# Patient Record
Sex: Male | Born: 1995 | Race: White | Hispanic: No | Marital: Single | Smoking: Never smoker
Health system: Southern US, Community
[De-identification: ages and names within clinical notes are randomized; demographics above are authoritative.]

## PROBLEM LIST (undated history)

## (undated) DIAGNOSIS — J45909 Unspecified asthma, uncomplicated: Secondary | ICD-10-CM

## (undated) DIAGNOSIS — R000 Tachycardia, unspecified: Secondary | ICD-10-CM

## (undated) HISTORY — PX: TONSILLECTOMY: SUR1361

---
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
## 2016-03-02 DIAGNOSIS — M67431 Ganglion, right wrist: Secondary | ICD-10-CM | POA: Diagnosis not present

## 2016-04-23 DIAGNOSIS — Z111 Encounter for screening for respiratory tuberculosis: Secondary | ICD-10-CM | POA: Diagnosis not present

## 2016-05-21 DIAGNOSIS — M25521 Pain in right elbow: Secondary | ICD-10-CM | POA: Diagnosis not present

## 2016-05-21 DIAGNOSIS — M67431 Ganglion, right wrist: Secondary | ICD-10-CM | POA: Diagnosis not present

## 2016-06-14 DIAGNOSIS — B079 Viral wart, unspecified: Secondary | ICD-10-CM | POA: Diagnosis not present

## 2016-08-08 DIAGNOSIS — J019 Acute sinusitis, unspecified: Secondary | ICD-10-CM | POA: Diagnosis not present

## 2016-08-26 DIAGNOSIS — B078 Other viral warts: Secondary | ICD-10-CM | POA: Diagnosis not present

## 2016-08-26 DIAGNOSIS — D1801 Hemangioma of skin and subcutaneous tissue: Secondary | ICD-10-CM | POA: Diagnosis not present

## 2016-08-26 DIAGNOSIS — D2261 Melanocytic nevi of right upper limb, including shoulder: Secondary | ICD-10-CM | POA: Diagnosis not present

## 2016-08-26 DIAGNOSIS — D2262 Melanocytic nevi of left upper limb, including shoulder: Secondary | ICD-10-CM | POA: Diagnosis not present

## 2016-08-26 DIAGNOSIS — D225 Melanocytic nevi of trunk: Secondary | ICD-10-CM | POA: Diagnosis not present

## 2016-09-27 DIAGNOSIS — J209 Acute bronchitis, unspecified: Secondary | ICD-10-CM | POA: Diagnosis not present

## 2016-09-27 DIAGNOSIS — R509 Fever, unspecified: Secondary | ICD-10-CM | POA: Diagnosis not present

## 2016-10-27 ENCOUNTER — Encounter: Payer: Self-pay | Admitting: Internal Medicine

## 2016-11-02 ENCOUNTER — Ambulatory Visit (INDEPENDENT_AMBULATORY_CARE_PROVIDER_SITE_OTHER): Payer: BLUE CROSS/BLUE SHIELD | Admitting: Internal Medicine

## 2016-11-02 VITALS — BP 110/90 | HR 94 | Ht 71.0 in | Wt 162.2 lb

## 2016-11-02 DIAGNOSIS — R000 Tachycardia, unspecified: Secondary | ICD-10-CM

## 2016-11-02 NOTE — Progress Notes (Signed)
      Patient Care Team: Farris HasAaron Morrow, MD as PCP - General (Family Medicine)   HPI  Joseph Hartman is a 20 y.o. male seeen in followup for sinus tach  He had a great semester at  [employer]  exercisng running with rapid rates but no LH    Records and Results Reviewed   Past Medical History:  Diagnosis Date  . Asthma   . Tachycardia    as a child he had episodes of tachycardia and was seen by pediatric cardiologist but no difinitive diagnosis could be made and it was self limiting    Past Surgical History:  Procedure Laterality Date  . TONSILLECTOMY      Current Outpatient Prescriptions  Medication Sig Dispense Refill  . albuterol (PROVENTIL HFA;VENTOLIN HFA) 108 (90 Base) MCG/ACT inhaler Inhale 1 puff into the lungs every 6 (six) hours as needed for wheezing or shortness of breath.    Marland Kitchen. omeprazole (PRILOSEC) 40 MG capsule Take 1 capsule (40 mg total) by mouth daily. 30 capsule 1   No current facility-administered medications for this visit.     Allergies  Allergen Reactions  . Cephalosporins Hives      Review of Systems negative except from HPI and PMH  Physical Exam BP 110/90   Pulse 94   Ht 5\' 11"  (1.803 m)   Wt 162 lb 3.2 oz (73.6 kg)   SpO2 98%   BMI 22.62 kg/m  Well developed and nourished in no acute distress HENT normal Neck supple with JVP-flat Clear Regular rate and rhythm, no murmurs or gallops Abd-soft with active BS No Clubbing cyanosis edema Skin-warm and dry A & Oriented  Grossly normal sensory and motor function  ECG personally reviewed  Sinus 89 15/09/33   Assessment and  Plan  Sinus tachy//dysautonomia   overall has done really well managing his symptoms  Continue current strategy including exercise        Current medicines are reviewed at length with the patient today .  The patient does not  have concerns regarding medicines.

## 2016-11-02 NOTE — Patient Instructions (Signed)
Medication Instructions: - Your physician recommends that you continue on your current medications as directed. Please refer to the Current Medication list given to you today.  Labwork: - none ordered  Procedures/Testing: - none ordered  Follow-Up: - Your physician wants you to follow-up in: May 2018 with Dr. Graciela HusbandsKlein. You will receive a reminder letter in the mail two months in advance. If you don't receive a letter, please call our office to schedule the follow-up appointment.  Any Additional Special Instructions Will Be Listed Below (If Applicable).     If you need a refill on your cardiac medications before your next appointment, please call your pharmacy.

## 2017-02-11 DIAGNOSIS — J309 Allergic rhinitis, unspecified: Secondary | ICD-10-CM | POA: Diagnosis not present

## 2017-02-11 DIAGNOSIS — Z68.41 Body mass index (BMI) pediatric, 5th percentile to less than 85th percentile for age: Secondary | ICD-10-CM | POA: Diagnosis not present

## 2017-03-29 DIAGNOSIS — D2271 Melanocytic nevi of right lower limb, including hip: Secondary | ICD-10-CM | POA: Diagnosis not present

## 2017-03-29 DIAGNOSIS — B070 Plantar wart: Secondary | ICD-10-CM | POA: Diagnosis not present

## 2017-05-01 DIAGNOSIS — J0100 Acute maxillary sinusitis, unspecified: Secondary | ICD-10-CM | POA: Diagnosis not present

## 2017-05-05 DIAGNOSIS — M67431 Ganglion, right wrist: Secondary | ICD-10-CM | POA: Diagnosis not present

## 2017-07-11 DIAGNOSIS — S99921A Unspecified injury of right foot, initial encounter: Secondary | ICD-10-CM | POA: Diagnosis not present

## 2017-07-11 DIAGNOSIS — Z68.41 Body mass index (BMI) pediatric, 5th percentile to less than 85th percentile for age: Secondary | ICD-10-CM | POA: Diagnosis not present

## 2017-08-23 DIAGNOSIS — J019 Acute sinusitis, unspecified: Secondary | ICD-10-CM | POA: Diagnosis not present

## 2017-08-23 DIAGNOSIS — Z68.41 Body mass index (BMI) pediatric, 5th percentile to less than 85th percentile for age: Secondary | ICD-10-CM | POA: Diagnosis not present

## 2017-10-31 DIAGNOSIS — Z23 Encounter for immunization: Secondary | ICD-10-CM | POA: Diagnosis not present

## 2017-10-31 DIAGNOSIS — F909 Attention-deficit hyperactivity disorder, unspecified type: Secondary | ICD-10-CM | POA: Diagnosis not present

## 2017-11-17 DIAGNOSIS — J329 Chronic sinusitis, unspecified: Secondary | ICD-10-CM | POA: Diagnosis not present

## 2017-12-16 DIAGNOSIS — J019 Acute sinusitis, unspecified: Secondary | ICD-10-CM | POA: Diagnosis not present

## 2017-12-16 DIAGNOSIS — Z68.41 Body mass index (BMI) pediatric, 5th percentile to less than 85th percentile for age: Secondary | ICD-10-CM | POA: Diagnosis not present

## 2017-12-16 DIAGNOSIS — J309 Allergic rhinitis, unspecified: Secondary | ICD-10-CM | POA: Diagnosis not present

## 2017-12-23 DIAGNOSIS — M67431 Ganglion, right wrist: Secondary | ICD-10-CM | POA: Diagnosis not present

## 2017-12-23 DIAGNOSIS — Z68.41 Body mass index (BMI) pediatric, 5th percentile to less than 85th percentile for age: Secondary | ICD-10-CM | POA: Diagnosis not present

## 2018-01-30 DIAGNOSIS — M67431 Ganglion, right wrist: Secondary | ICD-10-CM | POA: Diagnosis not present

## 2018-01-30 DIAGNOSIS — Z68.41 Body mass index (BMI) pediatric, 5th percentile to less than 85th percentile for age: Secondary | ICD-10-CM | POA: Diagnosis not present

## 2018-02-16 DIAGNOSIS — Z68.41 Body mass index (BMI) pediatric, 5th percentile to less than 85th percentile for age: Secondary | ICD-10-CM | POA: Diagnosis not present

## 2018-02-16 DIAGNOSIS — J988 Other specified respiratory disorders: Secondary | ICD-10-CM | POA: Diagnosis not present

## 2018-03-24 DIAGNOSIS — B356 Tinea cruris: Secondary | ICD-10-CM | POA: Diagnosis not present

## 2018-03-24 DIAGNOSIS — L309 Dermatitis, unspecified: Secondary | ICD-10-CM | POA: Diagnosis not present

## 2018-03-24 DIAGNOSIS — F909 Attention-deficit hyperactivity disorder, unspecified type: Secondary | ICD-10-CM | POA: Diagnosis not present

## 2018-03-28 DIAGNOSIS — M67431 Ganglion, right wrist: Secondary | ICD-10-CM | POA: Diagnosis not present

## 2018-05-03 DIAGNOSIS — M67431 Ganglion, right wrist: Secondary | ICD-10-CM | POA: Diagnosis not present

## 2018-06-16 DIAGNOSIS — D2261 Melanocytic nevi of right upper limb, including shoulder: Secondary | ICD-10-CM | POA: Diagnosis not present

## 2018-06-16 DIAGNOSIS — D2262 Melanocytic nevi of left upper limb, including shoulder: Secondary | ICD-10-CM | POA: Diagnosis not present

## 2018-06-16 DIAGNOSIS — M25631 Stiffness of right wrist, not elsewhere classified: Secondary | ICD-10-CM | POA: Diagnosis not present

## 2018-06-16 DIAGNOSIS — L720 Epidermal cyst: Secondary | ICD-10-CM | POA: Diagnosis not present

## 2018-07-19 DIAGNOSIS — J302 Other seasonal allergic rhinitis: Secondary | ICD-10-CM | POA: Diagnosis not present

## 2018-07-19 DIAGNOSIS — Z68.41 Body mass index (BMI) pediatric, 5th percentile to less than 85th percentile for age: Secondary | ICD-10-CM | POA: Diagnosis not present

## 2018-07-19 DIAGNOSIS — R0981 Nasal congestion: Secondary | ICD-10-CM | POA: Diagnosis not present

## 2018-07-26 DIAGNOSIS — J339 Nasal polyp, unspecified: Secondary | ICD-10-CM | POA: Diagnosis not present

## 2018-07-26 DIAGNOSIS — J324 Chronic pansinusitis: Secondary | ICD-10-CM | POA: Diagnosis not present

## 2018-08-03 DIAGNOSIS — J329 Chronic sinusitis, unspecified: Secondary | ICD-10-CM | POA: Diagnosis not present

## 2018-08-09 DIAGNOSIS — J324 Chronic pansinusitis: Secondary | ICD-10-CM | POA: Diagnosis not present

## 2018-08-09 DIAGNOSIS — J338 Other polyp of sinus: Secondary | ICD-10-CM | POA: Diagnosis not present

## 2018-08-28 DIAGNOSIS — J338 Other polyp of sinus: Secondary | ICD-10-CM | POA: Diagnosis not present

## 2018-08-28 DIAGNOSIS — J324 Chronic pansinusitis: Secondary | ICD-10-CM | POA: Diagnosis not present

## 2018-09-25 DIAGNOSIS — J339 Nasal polyp, unspecified: Secondary | ICD-10-CM | POA: Diagnosis not present

## 2018-09-25 DIAGNOSIS — J324 Chronic pansinusitis: Secondary | ICD-10-CM | POA: Diagnosis not present

## 2018-11-09 DIAGNOSIS — H1045 Other chronic allergic conjunctivitis: Secondary | ICD-10-CM | POA: Diagnosis not present

## 2018-11-09 DIAGNOSIS — J3089 Other allergic rhinitis: Secondary | ICD-10-CM | POA: Diagnosis not present

## 2018-11-09 DIAGNOSIS — J3081 Allergic rhinitis due to animal (cat) (dog) hair and dander: Secondary | ICD-10-CM | POA: Diagnosis not present

## 2018-11-09 DIAGNOSIS — J301 Allergic rhinitis due to pollen: Secondary | ICD-10-CM | POA: Diagnosis not present

## 2018-11-10 DIAGNOSIS — J3081 Allergic rhinitis due to animal (cat) (dog) hair and dander: Secondary | ICD-10-CM | POA: Diagnosis not present

## 2018-11-10 DIAGNOSIS — J301 Allergic rhinitis due to pollen: Secondary | ICD-10-CM | POA: Diagnosis not present

## 2018-11-13 DIAGNOSIS — J3089 Other allergic rhinitis: Secondary | ICD-10-CM | POA: Diagnosis not present

## 2018-11-15 DIAGNOSIS — W540XXA Bitten by dog, initial encounter: Secondary | ICD-10-CM | POA: Diagnosis not present

## 2018-11-15 DIAGNOSIS — S61452A Open bite of left hand, initial encounter: Secondary | ICD-10-CM | POA: Diagnosis not present

## 2018-11-17 DIAGNOSIS — J301 Allergic rhinitis due to pollen: Secondary | ICD-10-CM | POA: Diagnosis not present

## 2018-11-17 DIAGNOSIS — J3089 Other allergic rhinitis: Secondary | ICD-10-CM | POA: Diagnosis not present

## 2018-11-17 DIAGNOSIS — J3081 Allergic rhinitis due to animal (cat) (dog) hair and dander: Secondary | ICD-10-CM | POA: Diagnosis not present

## 2018-11-20 DIAGNOSIS — J3089 Other allergic rhinitis: Secondary | ICD-10-CM | POA: Diagnosis not present

## 2018-11-20 DIAGNOSIS — J301 Allergic rhinitis due to pollen: Secondary | ICD-10-CM | POA: Diagnosis not present

## 2018-11-20 DIAGNOSIS — J3081 Allergic rhinitis due to animal (cat) (dog) hair and dander: Secondary | ICD-10-CM | POA: Diagnosis not present

## 2018-11-27 DIAGNOSIS — J3089 Other allergic rhinitis: Secondary | ICD-10-CM | POA: Diagnosis not present

## 2018-12-06 DIAGNOSIS — J3089 Other allergic rhinitis: Secondary | ICD-10-CM | POA: Diagnosis not present

## 2018-12-11 DIAGNOSIS — J3089 Other allergic rhinitis: Secondary | ICD-10-CM | POA: Diagnosis not present

## 2018-12-13 DIAGNOSIS — J3089 Other allergic rhinitis: Secondary | ICD-10-CM | POA: Diagnosis not present

## 2018-12-25 DIAGNOSIS — J3089 Other allergic rhinitis: Secondary | ICD-10-CM | POA: Diagnosis not present

## 2019-01-03 DIAGNOSIS — J3089 Other allergic rhinitis: Secondary | ICD-10-CM | POA: Diagnosis not present

## 2019-01-15 DIAGNOSIS — J3089 Other allergic rhinitis: Secondary | ICD-10-CM | POA: Diagnosis not present

## 2019-01-29 DIAGNOSIS — J3089 Other allergic rhinitis: Secondary | ICD-10-CM | POA: Diagnosis not present

## 2019-02-02 DIAGNOSIS — J3089 Other allergic rhinitis: Secondary | ICD-10-CM | POA: Diagnosis not present

## 2019-02-06 DIAGNOSIS — J301 Allergic rhinitis due to pollen: Secondary | ICD-10-CM | POA: Diagnosis not present

## 2019-02-06 DIAGNOSIS — J3081 Allergic rhinitis due to animal (cat) (dog) hair and dander: Secondary | ICD-10-CM | POA: Diagnosis not present

## 2019-02-06 DIAGNOSIS — J3089 Other allergic rhinitis: Secondary | ICD-10-CM | POA: Diagnosis not present

## 2019-02-13 DIAGNOSIS — J301 Allergic rhinitis due to pollen: Secondary | ICD-10-CM | POA: Diagnosis not present

## 2019-02-13 DIAGNOSIS — J3081 Allergic rhinitis due to animal (cat) (dog) hair and dander: Secondary | ICD-10-CM | POA: Diagnosis not present

## 2019-02-13 DIAGNOSIS — J3089 Other allergic rhinitis: Secondary | ICD-10-CM | POA: Diagnosis not present

## 2019-02-15 DIAGNOSIS — J3081 Allergic rhinitis due to animal (cat) (dog) hair and dander: Secondary | ICD-10-CM | POA: Diagnosis not present

## 2019-02-15 DIAGNOSIS — J301 Allergic rhinitis due to pollen: Secondary | ICD-10-CM | POA: Diagnosis not present

## 2019-02-15 DIAGNOSIS — J3089 Other allergic rhinitis: Secondary | ICD-10-CM | POA: Diagnosis not present

## 2019-02-28 DIAGNOSIS — J301 Allergic rhinitis due to pollen: Secondary | ICD-10-CM | POA: Diagnosis not present

## 2019-02-28 DIAGNOSIS — J3089 Other allergic rhinitis: Secondary | ICD-10-CM | POA: Diagnosis not present

## 2019-02-28 DIAGNOSIS — J3081 Allergic rhinitis due to animal (cat) (dog) hair and dander: Secondary | ICD-10-CM | POA: Diagnosis not present

## 2019-03-12 DIAGNOSIS — J3089 Other allergic rhinitis: Secondary | ICD-10-CM | POA: Diagnosis not present

## 2019-03-12 DIAGNOSIS — J301 Allergic rhinitis due to pollen: Secondary | ICD-10-CM | POA: Diagnosis not present

## 2019-03-12 DIAGNOSIS — J3081 Allergic rhinitis due to animal (cat) (dog) hair and dander: Secondary | ICD-10-CM | POA: Diagnosis not present

## 2019-03-21 DIAGNOSIS — J3089 Other allergic rhinitis: Secondary | ICD-10-CM | POA: Diagnosis not present

## 2019-03-21 DIAGNOSIS — J301 Allergic rhinitis due to pollen: Secondary | ICD-10-CM | POA: Diagnosis not present

## 2019-03-21 DIAGNOSIS — J3081 Allergic rhinitis due to animal (cat) (dog) hair and dander: Secondary | ICD-10-CM | POA: Diagnosis not present

## 2019-03-23 DIAGNOSIS — J3089 Other allergic rhinitis: Secondary | ICD-10-CM | POA: Diagnosis not present

## 2019-03-23 DIAGNOSIS — J301 Allergic rhinitis due to pollen: Secondary | ICD-10-CM | POA: Diagnosis not present

## 2019-03-23 DIAGNOSIS — J3081 Allergic rhinitis due to animal (cat) (dog) hair and dander: Secondary | ICD-10-CM | POA: Diagnosis not present

## 2019-04-03 DIAGNOSIS — J301 Allergic rhinitis due to pollen: Secondary | ICD-10-CM | POA: Diagnosis not present

## 2019-04-03 DIAGNOSIS — J3081 Allergic rhinitis due to animal (cat) (dog) hair and dander: Secondary | ICD-10-CM | POA: Diagnosis not present

## 2019-04-03 DIAGNOSIS — J3089 Other allergic rhinitis: Secondary | ICD-10-CM | POA: Diagnosis not present

## 2019-04-10 DIAGNOSIS — J301 Allergic rhinitis due to pollen: Secondary | ICD-10-CM | POA: Diagnosis not present

## 2019-04-10 DIAGNOSIS — J3089 Other allergic rhinitis: Secondary | ICD-10-CM | POA: Diagnosis not present

## 2019-04-10 DIAGNOSIS — J3081 Allergic rhinitis due to animal (cat) (dog) hair and dander: Secondary | ICD-10-CM | POA: Diagnosis not present

## 2019-04-13 DIAGNOSIS — J3081 Allergic rhinitis due to animal (cat) (dog) hair and dander: Secondary | ICD-10-CM | POA: Diagnosis not present

## 2019-04-13 DIAGNOSIS — J3089 Other allergic rhinitis: Secondary | ICD-10-CM | POA: Diagnosis not present

## 2019-04-13 DIAGNOSIS — J301 Allergic rhinitis due to pollen: Secondary | ICD-10-CM | POA: Diagnosis not present

## 2019-04-17 DIAGNOSIS — J301 Allergic rhinitis due to pollen: Secondary | ICD-10-CM | POA: Diagnosis not present

## 2019-04-17 DIAGNOSIS — J3089 Other allergic rhinitis: Secondary | ICD-10-CM | POA: Diagnosis not present

## 2019-04-17 DIAGNOSIS — J3081 Allergic rhinitis due to animal (cat) (dog) hair and dander: Secondary | ICD-10-CM | POA: Diagnosis not present

## 2019-04-24 DIAGNOSIS — J3081 Allergic rhinitis due to animal (cat) (dog) hair and dander: Secondary | ICD-10-CM | POA: Diagnosis not present

## 2019-04-24 DIAGNOSIS — J3089 Other allergic rhinitis: Secondary | ICD-10-CM | POA: Diagnosis not present

## 2019-04-24 DIAGNOSIS — J301 Allergic rhinitis due to pollen: Secondary | ICD-10-CM | POA: Diagnosis not present

## 2019-04-27 DIAGNOSIS — J3089 Other allergic rhinitis: Secondary | ICD-10-CM | POA: Diagnosis not present

## 2019-04-27 DIAGNOSIS — J301 Allergic rhinitis due to pollen: Secondary | ICD-10-CM | POA: Diagnosis not present

## 2019-04-27 DIAGNOSIS — J3081 Allergic rhinitis due to animal (cat) (dog) hair and dander: Secondary | ICD-10-CM | POA: Diagnosis not present

## 2019-05-01 DIAGNOSIS — J3081 Allergic rhinitis due to animal (cat) (dog) hair and dander: Secondary | ICD-10-CM | POA: Diagnosis not present

## 2019-05-01 DIAGNOSIS — J3089 Other allergic rhinitis: Secondary | ICD-10-CM | POA: Diagnosis not present

## 2019-05-01 DIAGNOSIS — J301 Allergic rhinitis due to pollen: Secondary | ICD-10-CM | POA: Diagnosis not present

## 2019-05-03 DIAGNOSIS — J301 Allergic rhinitis due to pollen: Secondary | ICD-10-CM | POA: Diagnosis not present

## 2019-05-03 DIAGNOSIS — J3089 Other allergic rhinitis: Secondary | ICD-10-CM | POA: Diagnosis not present

## 2019-05-03 DIAGNOSIS — Z20828 Contact with and (suspected) exposure to other viral communicable diseases: Secondary | ICD-10-CM | POA: Diagnosis not present

## 2019-05-03 DIAGNOSIS — J3081 Allergic rhinitis due to animal (cat) (dog) hair and dander: Secondary | ICD-10-CM | POA: Diagnosis not present

## 2019-05-08 DIAGNOSIS — J301 Allergic rhinitis due to pollen: Secondary | ICD-10-CM | POA: Diagnosis not present

## 2019-05-08 DIAGNOSIS — J3081 Allergic rhinitis due to animal (cat) (dog) hair and dander: Secondary | ICD-10-CM | POA: Diagnosis not present

## 2019-05-08 DIAGNOSIS — J3089 Other allergic rhinitis: Secondary | ICD-10-CM | POA: Diagnosis not present

## 2019-05-15 DIAGNOSIS — J301 Allergic rhinitis due to pollen: Secondary | ICD-10-CM | POA: Diagnosis not present

## 2019-05-15 DIAGNOSIS — J3081 Allergic rhinitis due to animal (cat) (dog) hair and dander: Secondary | ICD-10-CM | POA: Diagnosis not present

## 2019-05-15 DIAGNOSIS — J3089 Other allergic rhinitis: Secondary | ICD-10-CM | POA: Diagnosis not present

## 2019-05-17 DIAGNOSIS — J3081 Allergic rhinitis due to animal (cat) (dog) hair and dander: Secondary | ICD-10-CM | POA: Diagnosis not present

## 2019-05-17 DIAGNOSIS — J3089 Other allergic rhinitis: Secondary | ICD-10-CM | POA: Diagnosis not present

## 2019-05-17 DIAGNOSIS — J301 Allergic rhinitis due to pollen: Secondary | ICD-10-CM | POA: Diagnosis not present

## 2019-05-24 DIAGNOSIS — J301 Allergic rhinitis due to pollen: Secondary | ICD-10-CM | POA: Diagnosis not present

## 2019-05-24 DIAGNOSIS — H1045 Other chronic allergic conjunctivitis: Secondary | ICD-10-CM | POA: Diagnosis not present

## 2019-05-24 DIAGNOSIS — J3089 Other allergic rhinitis: Secondary | ICD-10-CM | POA: Diagnosis not present

## 2019-05-24 DIAGNOSIS — J3081 Allergic rhinitis due to animal (cat) (dog) hair and dander: Secondary | ICD-10-CM | POA: Diagnosis not present

## 2019-05-30 DIAGNOSIS — Z20828 Contact with and (suspected) exposure to other viral communicable diseases: Secondary | ICD-10-CM | POA: Diagnosis not present

## 2019-06-05 DIAGNOSIS — G43009 Migraine without aura, not intractable, without status migrainosus: Secondary | ICD-10-CM | POA: Diagnosis not present

## 2019-06-06 DIAGNOSIS — J3089 Other allergic rhinitis: Secondary | ICD-10-CM | POA: Diagnosis not present

## 2019-06-06 DIAGNOSIS — J3081 Allergic rhinitis due to animal (cat) (dog) hair and dander: Secondary | ICD-10-CM | POA: Diagnosis not present

## 2019-06-06 DIAGNOSIS — J301 Allergic rhinitis due to pollen: Secondary | ICD-10-CM | POA: Diagnosis not present

## 2019-06-13 DIAGNOSIS — J3089 Other allergic rhinitis: Secondary | ICD-10-CM | POA: Diagnosis not present

## 2019-06-13 DIAGNOSIS — J301 Allergic rhinitis due to pollen: Secondary | ICD-10-CM | POA: Diagnosis not present

## 2019-06-13 DIAGNOSIS — J3081 Allergic rhinitis due to animal (cat) (dog) hair and dander: Secondary | ICD-10-CM | POA: Diagnosis not present

## 2019-06-19 DIAGNOSIS — J301 Allergic rhinitis due to pollen: Secondary | ICD-10-CM | POA: Diagnosis not present

## 2019-06-19 DIAGNOSIS — J3081 Allergic rhinitis due to animal (cat) (dog) hair and dander: Secondary | ICD-10-CM | POA: Diagnosis not present

## 2019-06-20 DIAGNOSIS — J301 Allergic rhinitis due to pollen: Secondary | ICD-10-CM | POA: Diagnosis not present

## 2019-06-20 DIAGNOSIS — J3081 Allergic rhinitis due to animal (cat) (dog) hair and dander: Secondary | ICD-10-CM | POA: Diagnosis not present

## 2019-06-20 DIAGNOSIS — J3089 Other allergic rhinitis: Secondary | ICD-10-CM | POA: Diagnosis not present

## 2019-06-29 DIAGNOSIS — J3089 Other allergic rhinitis: Secondary | ICD-10-CM | POA: Diagnosis not present

## 2019-07-06 DIAGNOSIS — J029 Acute pharyngitis, unspecified: Secondary | ICD-10-CM | POA: Diagnosis not present

## 2019-07-25 DIAGNOSIS — Z0184 Encounter for antibody response examination: Secondary | ICD-10-CM | POA: Diagnosis not present

## 2019-08-03 DIAGNOSIS — M9907 Segmental and somatic dysfunction of upper extremity: Secondary | ICD-10-CM | POA: Diagnosis not present

## 2019-08-03 DIAGNOSIS — M79632 Pain in left forearm: Secondary | ICD-10-CM | POA: Diagnosis not present

## 2019-08-03 DIAGNOSIS — M9908 Segmental and somatic dysfunction of rib cage: Secondary | ICD-10-CM | POA: Diagnosis not present

## 2019-08-03 DIAGNOSIS — J3089 Other allergic rhinitis: Secondary | ICD-10-CM | POA: Diagnosis not present

## 2019-09-11 DIAGNOSIS — J3089 Other allergic rhinitis: Secondary | ICD-10-CM | POA: Diagnosis not present

## 2019-09-26 DIAGNOSIS — J3089 Other allergic rhinitis: Secondary | ICD-10-CM | POA: Diagnosis not present

## 2019-10-01 DIAGNOSIS — J3089 Other allergic rhinitis: Secondary | ICD-10-CM | POA: Diagnosis not present

## 2019-10-05 DIAGNOSIS — J3089 Other allergic rhinitis: Secondary | ICD-10-CM | POA: Diagnosis not present

## 2019-10-31 DIAGNOSIS — J3081 Allergic rhinitis due to animal (cat) (dog) hair and dander: Secondary | ICD-10-CM | POA: Diagnosis not present

## 2019-10-31 DIAGNOSIS — J301 Allergic rhinitis due to pollen: Secondary | ICD-10-CM | POA: Diagnosis not present

## 2019-10-31 DIAGNOSIS — J3089 Other allergic rhinitis: Secondary | ICD-10-CM | POA: Diagnosis not present

## 2019-11-06 DIAGNOSIS — J3081 Allergic rhinitis due to animal (cat) (dog) hair and dander: Secondary | ICD-10-CM | POA: Diagnosis not present

## 2019-11-06 DIAGNOSIS — J301 Allergic rhinitis due to pollen: Secondary | ICD-10-CM | POA: Diagnosis not present

## 2019-11-06 DIAGNOSIS — J3089 Other allergic rhinitis: Secondary | ICD-10-CM | POA: Diagnosis not present

## 2019-11-22 DIAGNOSIS — J301 Allergic rhinitis due to pollen: Secondary | ICD-10-CM | POA: Diagnosis not present

## 2019-11-22 DIAGNOSIS — J3089 Other allergic rhinitis: Secondary | ICD-10-CM | POA: Diagnosis not present

## 2019-11-22 DIAGNOSIS — J3081 Allergic rhinitis due to animal (cat) (dog) hair and dander: Secondary | ICD-10-CM | POA: Diagnosis not present

## 2019-12-05 DIAGNOSIS — Z20822 Contact with and (suspected) exposure to covid-19: Secondary | ICD-10-CM | POA: Diagnosis not present

## 2019-12-07 DIAGNOSIS — R05 Cough: Secondary | ICD-10-CM | POA: Diagnosis not present

## 2019-12-07 DIAGNOSIS — R197 Diarrhea, unspecified: Secondary | ICD-10-CM | POA: Diagnosis not present

## 2019-12-07 DIAGNOSIS — R070 Pain in throat: Secondary | ICD-10-CM | POA: Diagnosis not present

## 2019-12-07 DIAGNOSIS — R0981 Nasal congestion: Secondary | ICD-10-CM | POA: Diagnosis not present

## 2019-12-07 DIAGNOSIS — Z20822 Contact with and (suspected) exposure to covid-19: Secondary | ICD-10-CM | POA: Diagnosis not present

## 2019-12-07 DIAGNOSIS — R5383 Other fatigue: Secondary | ICD-10-CM | POA: Diagnosis not present

## 2019-12-12 DIAGNOSIS — J3089 Other allergic rhinitis: Secondary | ICD-10-CM | POA: Diagnosis not present

## 2019-12-25 DIAGNOSIS — J3089 Other allergic rhinitis: Secondary | ICD-10-CM | POA: Diagnosis not present

## 2020-01-04 DIAGNOSIS — J3089 Other allergic rhinitis: Secondary | ICD-10-CM | POA: Diagnosis not present

## 2020-01-16 DIAGNOSIS — J3089 Other allergic rhinitis: Secondary | ICD-10-CM | POA: Diagnosis not present

## 2020-01-31 DIAGNOSIS — J3089 Other allergic rhinitis: Secondary | ICD-10-CM | POA: Diagnosis not present

## 2020-02-21 DIAGNOSIS — J3089 Other allergic rhinitis: Secondary | ICD-10-CM | POA: Diagnosis not present

## 2020-03-07 DIAGNOSIS — J3089 Other allergic rhinitis: Secondary | ICD-10-CM | POA: Diagnosis not present

## 2020-04-02 DIAGNOSIS — G43009 Migraine without aura, not intractable, without status migrainosus: Secondary | ICD-10-CM | POA: Diagnosis not present

## 2020-04-02 DIAGNOSIS — F439 Reaction to severe stress, unspecified: Secondary | ICD-10-CM | POA: Diagnosis not present

## 2020-04-15 DIAGNOSIS — J3089 Other allergic rhinitis: Secondary | ICD-10-CM | POA: Diagnosis not present

## 2020-04-29 DIAGNOSIS — J3089 Other allergic rhinitis: Secondary | ICD-10-CM | POA: Diagnosis not present

## 2020-05-06 DIAGNOSIS — J3089 Other allergic rhinitis: Secondary | ICD-10-CM | POA: Diagnosis not present

## 2020-05-06 DIAGNOSIS — J301 Allergic rhinitis due to pollen: Secondary | ICD-10-CM | POA: Diagnosis not present

## 2020-05-06 DIAGNOSIS — J3081 Allergic rhinitis due to animal (cat) (dog) hair and dander: Secondary | ICD-10-CM | POA: Diagnosis not present

## 2020-05-14 DIAGNOSIS — J3089 Other allergic rhinitis: Secondary | ICD-10-CM | POA: Diagnosis not present

## 2020-05-14 DIAGNOSIS — J3081 Allergic rhinitis due to animal (cat) (dog) hair and dander: Secondary | ICD-10-CM | POA: Diagnosis not present

## 2020-05-14 DIAGNOSIS — J301 Allergic rhinitis due to pollen: Secondary | ICD-10-CM | POA: Diagnosis not present

## 2020-05-15 DIAGNOSIS — D485 Neoplasm of uncertain behavior of skin: Secondary | ICD-10-CM | POA: Diagnosis not present

## 2020-05-15 DIAGNOSIS — D2362 Other benign neoplasm of skin of left upper limb, including shoulder: Secondary | ICD-10-CM | POA: Diagnosis not present

## 2020-05-15 DIAGNOSIS — D2262 Melanocytic nevi of left upper limb, including shoulder: Secondary | ICD-10-CM | POA: Diagnosis not present

## 2020-05-15 DIAGNOSIS — D2271 Melanocytic nevi of right lower limb, including hip: Secondary | ICD-10-CM | POA: Diagnosis not present

## 2020-05-15 DIAGNOSIS — D2272 Melanocytic nevi of left lower limb, including hip: Secondary | ICD-10-CM | POA: Diagnosis not present

## 2020-05-15 DIAGNOSIS — D225 Melanocytic nevi of trunk: Secondary | ICD-10-CM | POA: Diagnosis not present

## 2020-05-15 DIAGNOSIS — D2261 Melanocytic nevi of right upper limb, including shoulder: Secondary | ICD-10-CM | POA: Diagnosis not present

## 2020-05-22 DIAGNOSIS — J3089 Other allergic rhinitis: Secondary | ICD-10-CM | POA: Diagnosis not present

## 2020-05-22 DIAGNOSIS — J301 Allergic rhinitis due to pollen: Secondary | ICD-10-CM | POA: Diagnosis not present

## 2020-05-22 DIAGNOSIS — J3081 Allergic rhinitis due to animal (cat) (dog) hair and dander: Secondary | ICD-10-CM | POA: Diagnosis not present

## 2020-05-28 DIAGNOSIS — J3089 Other allergic rhinitis: Secondary | ICD-10-CM | POA: Diagnosis not present

## 2020-05-28 DIAGNOSIS — J301 Allergic rhinitis due to pollen: Secondary | ICD-10-CM | POA: Diagnosis not present

## 2020-05-28 DIAGNOSIS — J3081 Allergic rhinitis due to animal (cat) (dog) hair and dander: Secondary | ICD-10-CM | POA: Diagnosis not present

## 2020-06-04 DIAGNOSIS — J3081 Allergic rhinitis due to animal (cat) (dog) hair and dander: Secondary | ICD-10-CM | POA: Diagnosis not present

## 2020-06-04 DIAGNOSIS — J301 Allergic rhinitis due to pollen: Secondary | ICD-10-CM | POA: Diagnosis not present

## 2020-06-04 DIAGNOSIS — J3089 Other allergic rhinitis: Secondary | ICD-10-CM | POA: Diagnosis not present

## 2020-06-05 DIAGNOSIS — J3089 Other allergic rhinitis: Secondary | ICD-10-CM | POA: Diagnosis not present

## 2020-06-05 DIAGNOSIS — J301 Allergic rhinitis due to pollen: Secondary | ICD-10-CM | POA: Diagnosis not present

## 2020-06-05 DIAGNOSIS — H1045 Other chronic allergic conjunctivitis: Secondary | ICD-10-CM | POA: Diagnosis not present

## 2020-06-05 DIAGNOSIS — J3081 Allergic rhinitis due to animal (cat) (dog) hair and dander: Secondary | ICD-10-CM | POA: Diagnosis not present

## 2020-06-12 DIAGNOSIS — J3089 Other allergic rhinitis: Secondary | ICD-10-CM | POA: Diagnosis not present

## 2020-06-12 DIAGNOSIS — J301 Allergic rhinitis due to pollen: Secondary | ICD-10-CM | POA: Diagnosis not present

## 2020-06-12 DIAGNOSIS — J3081 Allergic rhinitis due to animal (cat) (dog) hair and dander: Secondary | ICD-10-CM | POA: Diagnosis not present

## 2020-06-13 DIAGNOSIS — J301 Allergic rhinitis due to pollen: Secondary | ICD-10-CM | POA: Diagnosis not present

## 2020-06-13 DIAGNOSIS — J3081 Allergic rhinitis due to animal (cat) (dog) hair and dander: Secondary | ICD-10-CM | POA: Diagnosis not present

## 2020-06-16 DIAGNOSIS — J3089 Other allergic rhinitis: Secondary | ICD-10-CM | POA: Diagnosis not present

## 2020-06-18 DIAGNOSIS — J301 Allergic rhinitis due to pollen: Secondary | ICD-10-CM | POA: Diagnosis not present

## 2020-06-18 DIAGNOSIS — J3081 Allergic rhinitis due to animal (cat) (dog) hair and dander: Secondary | ICD-10-CM | POA: Diagnosis not present

## 2020-06-18 DIAGNOSIS — J3089 Other allergic rhinitis: Secondary | ICD-10-CM | POA: Diagnosis not present

## 2020-06-24 DIAGNOSIS — D485 Neoplasm of uncertain behavior of skin: Secondary | ICD-10-CM | POA: Diagnosis not present

## 2020-06-24 DIAGNOSIS — L988 Other specified disorders of the skin and subcutaneous tissue: Secondary | ICD-10-CM | POA: Diagnosis not present

## 2020-06-26 DIAGNOSIS — J3089 Other allergic rhinitis: Secondary | ICD-10-CM | POA: Diagnosis not present

## 2020-06-26 DIAGNOSIS — J3081 Allergic rhinitis due to animal (cat) (dog) hair and dander: Secondary | ICD-10-CM | POA: Diagnosis not present

## 2020-06-26 DIAGNOSIS — J301 Allergic rhinitis due to pollen: Secondary | ICD-10-CM | POA: Diagnosis not present

## 2020-07-01 DIAGNOSIS — J3089 Other allergic rhinitis: Secondary | ICD-10-CM | POA: Diagnosis not present

## 2020-07-01 DIAGNOSIS — J301 Allergic rhinitis due to pollen: Secondary | ICD-10-CM | POA: Diagnosis not present

## 2020-07-01 DIAGNOSIS — J3081 Allergic rhinitis due to animal (cat) (dog) hair and dander: Secondary | ICD-10-CM | POA: Diagnosis not present

## 2020-07-08 DIAGNOSIS — J3081 Allergic rhinitis due to animal (cat) (dog) hair and dander: Secondary | ICD-10-CM | POA: Diagnosis not present

## 2020-07-08 DIAGNOSIS — J301 Allergic rhinitis due to pollen: Secondary | ICD-10-CM | POA: Diagnosis not present

## 2020-07-08 DIAGNOSIS — J3089 Other allergic rhinitis: Secondary | ICD-10-CM | POA: Diagnosis not present

## 2020-07-10 DIAGNOSIS — J3081 Allergic rhinitis due to animal (cat) (dog) hair and dander: Secondary | ICD-10-CM | POA: Diagnosis not present

## 2020-07-10 DIAGNOSIS — J3089 Other allergic rhinitis: Secondary | ICD-10-CM | POA: Diagnosis not present

## 2020-07-10 DIAGNOSIS — J301 Allergic rhinitis due to pollen: Secondary | ICD-10-CM | POA: Diagnosis not present

## 2020-07-16 DIAGNOSIS — J3089 Other allergic rhinitis: Secondary | ICD-10-CM | POA: Diagnosis not present

## 2020-07-16 DIAGNOSIS — J3081 Allergic rhinitis due to animal (cat) (dog) hair and dander: Secondary | ICD-10-CM | POA: Diagnosis not present

## 2020-07-16 DIAGNOSIS — J301 Allergic rhinitis due to pollen: Secondary | ICD-10-CM | POA: Diagnosis not present

## 2020-07-23 DIAGNOSIS — J3089 Other allergic rhinitis: Secondary | ICD-10-CM | POA: Diagnosis not present

## 2020-07-23 DIAGNOSIS — J3081 Allergic rhinitis due to animal (cat) (dog) hair and dander: Secondary | ICD-10-CM | POA: Diagnosis not present

## 2020-07-23 DIAGNOSIS — J301 Allergic rhinitis due to pollen: Secondary | ICD-10-CM | POA: Diagnosis not present

## 2020-07-24 DIAGNOSIS — K5900 Constipation, unspecified: Secondary | ICD-10-CM | POA: Diagnosis not present

## 2020-07-24 DIAGNOSIS — R1084 Generalized abdominal pain: Secondary | ICD-10-CM | POA: Diagnosis not present

## 2020-07-30 DIAGNOSIS — J3081 Allergic rhinitis due to animal (cat) (dog) hair and dander: Secondary | ICD-10-CM | POA: Diagnosis not present

## 2020-07-30 DIAGNOSIS — J301 Allergic rhinitis due to pollen: Secondary | ICD-10-CM | POA: Diagnosis not present

## 2020-07-30 DIAGNOSIS — J3089 Other allergic rhinitis: Secondary | ICD-10-CM | POA: Diagnosis not present

## 2020-08-06 DIAGNOSIS — J0100 Acute maxillary sinusitis, unspecified: Secondary | ICD-10-CM | POA: Diagnosis not present

## 2020-08-06 DIAGNOSIS — J301 Allergic rhinitis due to pollen: Secondary | ICD-10-CM | POA: Diagnosis not present

## 2020-08-06 DIAGNOSIS — J3081 Allergic rhinitis due to animal (cat) (dog) hair and dander: Secondary | ICD-10-CM | POA: Diagnosis not present

## 2020-08-06 DIAGNOSIS — J3089 Other allergic rhinitis: Secondary | ICD-10-CM | POA: Diagnosis not present

## 2020-08-11 DIAGNOSIS — J3089 Other allergic rhinitis: Secondary | ICD-10-CM | POA: Diagnosis not present

## 2020-08-11 DIAGNOSIS — J301 Allergic rhinitis due to pollen: Secondary | ICD-10-CM | POA: Diagnosis not present

## 2020-08-11 DIAGNOSIS — J3081 Allergic rhinitis due to animal (cat) (dog) hair and dander: Secondary | ICD-10-CM | POA: Diagnosis not present

## 2020-08-20 DIAGNOSIS — J301 Allergic rhinitis due to pollen: Secondary | ICD-10-CM | POA: Diagnosis not present

## 2020-08-20 DIAGNOSIS — J3089 Other allergic rhinitis: Secondary | ICD-10-CM | POA: Diagnosis not present

## 2020-08-20 DIAGNOSIS — J3081 Allergic rhinitis due to animal (cat) (dog) hair and dander: Secondary | ICD-10-CM | POA: Diagnosis not present

## 2020-08-27 DIAGNOSIS — J3089 Other allergic rhinitis: Secondary | ICD-10-CM | POA: Diagnosis not present

## 2020-08-27 DIAGNOSIS — J3081 Allergic rhinitis due to animal (cat) (dog) hair and dander: Secondary | ICD-10-CM | POA: Diagnosis not present

## 2020-08-27 DIAGNOSIS — J301 Allergic rhinitis due to pollen: Secondary | ICD-10-CM | POA: Diagnosis not present

## 2020-09-03 DIAGNOSIS — J301 Allergic rhinitis due to pollen: Secondary | ICD-10-CM | POA: Diagnosis not present

## 2020-09-03 DIAGNOSIS — J3089 Other allergic rhinitis: Secondary | ICD-10-CM | POA: Diagnosis not present

## 2020-09-03 DIAGNOSIS — J3081 Allergic rhinitis due to animal (cat) (dog) hair and dander: Secondary | ICD-10-CM | POA: Diagnosis not present

## 2020-09-10 DIAGNOSIS — J3089 Other allergic rhinitis: Secondary | ICD-10-CM | POA: Diagnosis not present

## 2020-09-10 DIAGNOSIS — J301 Allergic rhinitis due to pollen: Secondary | ICD-10-CM | POA: Diagnosis not present

## 2020-09-10 DIAGNOSIS — J3081 Allergic rhinitis due to animal (cat) (dog) hair and dander: Secondary | ICD-10-CM | POA: Diagnosis not present

## 2020-09-17 DIAGNOSIS — J3089 Other allergic rhinitis: Secondary | ICD-10-CM | POA: Diagnosis not present

## 2020-09-17 DIAGNOSIS — J301 Allergic rhinitis due to pollen: Secondary | ICD-10-CM | POA: Diagnosis not present

## 2020-09-17 DIAGNOSIS — J3081 Allergic rhinitis due to animal (cat) (dog) hair and dander: Secondary | ICD-10-CM | POA: Diagnosis not present

## 2020-09-19 DIAGNOSIS — G43009 Migraine without aura, not intractable, without status migrainosus: Secondary | ICD-10-CM | POA: Diagnosis not present

## 2020-09-19 DIAGNOSIS — G479 Sleep disorder, unspecified: Secondary | ICD-10-CM | POA: Diagnosis not present

## 2020-09-25 DIAGNOSIS — J3089 Other allergic rhinitis: Secondary | ICD-10-CM | POA: Diagnosis not present

## 2020-09-25 DIAGNOSIS — J301 Allergic rhinitis due to pollen: Secondary | ICD-10-CM | POA: Diagnosis not present

## 2020-09-25 DIAGNOSIS — J3081 Allergic rhinitis due to animal (cat) (dog) hair and dander: Secondary | ICD-10-CM | POA: Diagnosis not present

## 2020-10-01 DIAGNOSIS — J301 Allergic rhinitis due to pollen: Secondary | ICD-10-CM | POA: Diagnosis not present

## 2020-10-01 DIAGNOSIS — J3081 Allergic rhinitis due to animal (cat) (dog) hair and dander: Secondary | ICD-10-CM | POA: Diagnosis not present

## 2020-10-01 DIAGNOSIS — J3089 Other allergic rhinitis: Secondary | ICD-10-CM | POA: Diagnosis not present

## 2020-10-14 DIAGNOSIS — J301 Allergic rhinitis due to pollen: Secondary | ICD-10-CM | POA: Diagnosis not present

## 2020-10-14 DIAGNOSIS — J3089 Other allergic rhinitis: Secondary | ICD-10-CM | POA: Diagnosis not present

## 2020-10-14 DIAGNOSIS — J3081 Allergic rhinitis due to animal (cat) (dog) hair and dander: Secondary | ICD-10-CM | POA: Diagnosis not present

## 2020-10-22 DIAGNOSIS — J301 Allergic rhinitis due to pollen: Secondary | ICD-10-CM | POA: Diagnosis not present

## 2020-10-22 DIAGNOSIS — J3081 Allergic rhinitis due to animal (cat) (dog) hair and dander: Secondary | ICD-10-CM | POA: Diagnosis not present

## 2020-10-22 DIAGNOSIS — J3089 Other allergic rhinitis: Secondary | ICD-10-CM | POA: Diagnosis not present

## 2020-10-30 DIAGNOSIS — J301 Allergic rhinitis due to pollen: Secondary | ICD-10-CM | POA: Diagnosis not present

## 2020-10-30 DIAGNOSIS — J3089 Other allergic rhinitis: Secondary | ICD-10-CM | POA: Diagnosis not present

## 2020-10-30 DIAGNOSIS — J3081 Allergic rhinitis due to animal (cat) (dog) hair and dander: Secondary | ICD-10-CM | POA: Diagnosis not present

## 2020-11-19 DIAGNOSIS — J3089 Other allergic rhinitis: Secondary | ICD-10-CM | POA: Diagnosis not present

## 2020-11-19 DIAGNOSIS — J3081 Allergic rhinitis due to animal (cat) (dog) hair and dander: Secondary | ICD-10-CM | POA: Diagnosis not present

## 2020-11-19 DIAGNOSIS — J301 Allergic rhinitis due to pollen: Secondary | ICD-10-CM | POA: Diagnosis not present

## 2020-12-02 DIAGNOSIS — J3089 Other allergic rhinitis: Secondary | ICD-10-CM | POA: Diagnosis not present

## 2020-12-02 DIAGNOSIS — J301 Allergic rhinitis due to pollen: Secondary | ICD-10-CM | POA: Diagnosis not present

## 2020-12-02 DIAGNOSIS — J3081 Allergic rhinitis due to animal (cat) (dog) hair and dander: Secondary | ICD-10-CM | POA: Diagnosis not present

## 2020-12-10 DIAGNOSIS — J3081 Allergic rhinitis due to animal (cat) (dog) hair and dander: Secondary | ICD-10-CM | POA: Diagnosis not present

## 2020-12-10 DIAGNOSIS — J3089 Other allergic rhinitis: Secondary | ICD-10-CM | POA: Diagnosis not present

## 2020-12-10 DIAGNOSIS — J301 Allergic rhinitis due to pollen: Secondary | ICD-10-CM | POA: Diagnosis not present

## 2020-12-12 DIAGNOSIS — J301 Allergic rhinitis due to pollen: Secondary | ICD-10-CM | POA: Diagnosis not present

## 2020-12-12 DIAGNOSIS — J3081 Allergic rhinitis due to animal (cat) (dog) hair and dander: Secondary | ICD-10-CM | POA: Diagnosis not present

## 2020-12-15 DIAGNOSIS — J3089 Other allergic rhinitis: Secondary | ICD-10-CM | POA: Diagnosis not present

## 2020-12-17 DIAGNOSIS — J3081 Allergic rhinitis due to animal (cat) (dog) hair and dander: Secondary | ICD-10-CM | POA: Diagnosis not present

## 2020-12-17 DIAGNOSIS — J3089 Other allergic rhinitis: Secondary | ICD-10-CM | POA: Diagnosis not present

## 2020-12-17 DIAGNOSIS — J301 Allergic rhinitis due to pollen: Secondary | ICD-10-CM | POA: Diagnosis not present

## 2020-12-25 DIAGNOSIS — J3081 Allergic rhinitis due to animal (cat) (dog) hair and dander: Secondary | ICD-10-CM | POA: Diagnosis not present

## 2020-12-25 DIAGNOSIS — J3089 Other allergic rhinitis: Secondary | ICD-10-CM | POA: Diagnosis not present

## 2020-12-25 DIAGNOSIS — J301 Allergic rhinitis due to pollen: Secondary | ICD-10-CM | POA: Diagnosis not present

## 2020-12-31 DIAGNOSIS — J301 Allergic rhinitis due to pollen: Secondary | ICD-10-CM | POA: Diagnosis not present

## 2020-12-31 DIAGNOSIS — J3089 Other allergic rhinitis: Secondary | ICD-10-CM | POA: Diagnosis not present

## 2020-12-31 DIAGNOSIS — J3081 Allergic rhinitis due to animal (cat) (dog) hair and dander: Secondary | ICD-10-CM | POA: Diagnosis not present

## 2021-01-06 DIAGNOSIS — J301 Allergic rhinitis due to pollen: Secondary | ICD-10-CM | POA: Diagnosis not present

## 2021-01-06 DIAGNOSIS — J3089 Other allergic rhinitis: Secondary | ICD-10-CM | POA: Diagnosis not present

## 2021-01-06 DIAGNOSIS — J3081 Allergic rhinitis due to animal (cat) (dog) hair and dander: Secondary | ICD-10-CM | POA: Diagnosis not present

## 2021-01-09 DIAGNOSIS — J301 Allergic rhinitis due to pollen: Secondary | ICD-10-CM | POA: Diagnosis not present

## 2021-01-09 DIAGNOSIS — J3081 Allergic rhinitis due to animal (cat) (dog) hair and dander: Secondary | ICD-10-CM | POA: Diagnosis not present

## 2021-01-09 DIAGNOSIS — J3089 Other allergic rhinitis: Secondary | ICD-10-CM | POA: Diagnosis not present

## 2021-01-14 DIAGNOSIS — J301 Allergic rhinitis due to pollen: Secondary | ICD-10-CM | POA: Diagnosis not present

## 2021-01-14 DIAGNOSIS — J3089 Other allergic rhinitis: Secondary | ICD-10-CM | POA: Diagnosis not present

## 2021-01-14 DIAGNOSIS — J3081 Allergic rhinitis due to animal (cat) (dog) hair and dander: Secondary | ICD-10-CM | POA: Diagnosis not present

## 2021-01-16 DIAGNOSIS — J3081 Allergic rhinitis due to animal (cat) (dog) hair and dander: Secondary | ICD-10-CM | POA: Diagnosis not present

## 2021-01-16 DIAGNOSIS — J301 Allergic rhinitis due to pollen: Secondary | ICD-10-CM | POA: Diagnosis not present

## 2021-01-16 DIAGNOSIS — J3089 Other allergic rhinitis: Secondary | ICD-10-CM | POA: Diagnosis not present

## 2021-01-19 DIAGNOSIS — J301 Allergic rhinitis due to pollen: Secondary | ICD-10-CM | POA: Diagnosis not present

## 2021-01-19 DIAGNOSIS — J3081 Allergic rhinitis due to animal (cat) (dog) hair and dander: Secondary | ICD-10-CM | POA: Diagnosis not present

## 2021-01-19 DIAGNOSIS — J3089 Other allergic rhinitis: Secondary | ICD-10-CM | POA: Diagnosis not present

## 2021-01-21 DIAGNOSIS — J301 Allergic rhinitis due to pollen: Secondary | ICD-10-CM | POA: Diagnosis not present

## 2021-01-21 DIAGNOSIS — J3081 Allergic rhinitis due to animal (cat) (dog) hair and dander: Secondary | ICD-10-CM | POA: Diagnosis not present

## 2021-01-21 DIAGNOSIS — J3089 Other allergic rhinitis: Secondary | ICD-10-CM | POA: Diagnosis not present

## 2021-01-27 DIAGNOSIS — K219 Gastro-esophageal reflux disease without esophagitis: Secondary | ICD-10-CM | POA: Diagnosis not present

## 2021-01-27 DIAGNOSIS — R1310 Dysphagia, unspecified: Secondary | ICD-10-CM | POA: Diagnosis not present

## 2021-01-27 DIAGNOSIS — E291 Testicular hypofunction: Secondary | ICD-10-CM | POA: Diagnosis not present

## 2021-01-27 DIAGNOSIS — Z13220 Encounter for screening for lipoid disorders: Secondary | ICD-10-CM | POA: Diagnosis not present

## 2021-01-28 DIAGNOSIS — J3081 Allergic rhinitis due to animal (cat) (dog) hair and dander: Secondary | ICD-10-CM | POA: Diagnosis not present

## 2021-01-28 DIAGNOSIS — J3089 Other allergic rhinitis: Secondary | ICD-10-CM | POA: Diagnosis not present

## 2021-01-28 DIAGNOSIS — J301 Allergic rhinitis due to pollen: Secondary | ICD-10-CM | POA: Diagnosis not present

## 2021-01-28 DIAGNOSIS — R1310 Dysphagia, unspecified: Secondary | ICD-10-CM | POA: Diagnosis not present

## 2021-01-28 DIAGNOSIS — E291 Testicular hypofunction: Secondary | ICD-10-CM | POA: Diagnosis not present

## 2021-01-28 DIAGNOSIS — Z13220 Encounter for screening for lipoid disorders: Secondary | ICD-10-CM | POA: Diagnosis not present

## 2021-02-06 DIAGNOSIS — J3089 Other allergic rhinitis: Secondary | ICD-10-CM | POA: Diagnosis not present

## 2021-02-06 DIAGNOSIS — J301 Allergic rhinitis due to pollen: Secondary | ICD-10-CM | POA: Diagnosis not present

## 2021-02-06 DIAGNOSIS — J3081 Allergic rhinitis due to animal (cat) (dog) hair and dander: Secondary | ICD-10-CM | POA: Diagnosis not present

## 2021-02-12 ENCOUNTER — Other Ambulatory Visit: Payer: Self-pay | Admitting: Physician Assistant

## 2021-02-12 DIAGNOSIS — R1310 Dysphagia, unspecified: Secondary | ICD-10-CM | POA: Diagnosis not present

## 2021-02-12 DIAGNOSIS — J3089 Other allergic rhinitis: Secondary | ICD-10-CM | POA: Diagnosis not present

## 2021-02-12 DIAGNOSIS — J3081 Allergic rhinitis due to animal (cat) (dog) hair and dander: Secondary | ICD-10-CM | POA: Diagnosis not present

## 2021-02-12 DIAGNOSIS — J301 Allergic rhinitis due to pollen: Secondary | ICD-10-CM | POA: Diagnosis not present

## 2021-02-18 DIAGNOSIS — J3081 Allergic rhinitis due to animal (cat) (dog) hair and dander: Secondary | ICD-10-CM | POA: Diagnosis not present

## 2021-02-18 DIAGNOSIS — J3089 Other allergic rhinitis: Secondary | ICD-10-CM | POA: Diagnosis not present

## 2021-02-18 DIAGNOSIS — J301 Allergic rhinitis due to pollen: Secondary | ICD-10-CM | POA: Diagnosis not present

## 2021-02-23 ENCOUNTER — Ambulatory Visit
Admission: RE | Admit: 2021-02-23 | Discharge: 2021-02-23 | Disposition: A | Discharge status: Home / Self Care | Payer: BLUE CROSS/BLUE SHIELD | Source: Ambulatory Visit | Attending: Physician Assistant | Admitting: Physician Assistant

## 2021-02-23 DIAGNOSIS — R1310 Dysphagia, unspecified: Secondary | ICD-10-CM | POA: Diagnosis not present

## 2021-02-24 DIAGNOSIS — F439 Reaction to severe stress, unspecified: Secondary | ICD-10-CM | POA: Diagnosis not present

## 2021-02-24 DIAGNOSIS — G43009 Migraine without aura, not intractable, without status migrainosus: Secondary | ICD-10-CM | POA: Diagnosis not present

## 2021-02-24 DIAGNOSIS — G479 Sleep disorder, unspecified: Secondary | ICD-10-CM | POA: Diagnosis not present

## 2021-02-25 DIAGNOSIS — J3089 Other allergic rhinitis: Secondary | ICD-10-CM | POA: Diagnosis not present

## 2021-02-25 DIAGNOSIS — J3081 Allergic rhinitis due to animal (cat) (dog) hair and dander: Secondary | ICD-10-CM | POA: Diagnosis not present

## 2021-02-25 DIAGNOSIS — J301 Allergic rhinitis due to pollen: Secondary | ICD-10-CM | POA: Diagnosis not present

## 2021-03-03 DIAGNOSIS — J301 Allergic rhinitis due to pollen: Secondary | ICD-10-CM | POA: Diagnosis not present

## 2021-03-03 DIAGNOSIS — J3089 Other allergic rhinitis: Secondary | ICD-10-CM | POA: Diagnosis not present

## 2021-03-03 DIAGNOSIS — J3081 Allergic rhinitis due to animal (cat) (dog) hair and dander: Secondary | ICD-10-CM | POA: Diagnosis not present

## 2021-03-12 DIAGNOSIS — J3089 Other allergic rhinitis: Secondary | ICD-10-CM | POA: Diagnosis not present

## 2021-03-12 DIAGNOSIS — J301 Allergic rhinitis due to pollen: Secondary | ICD-10-CM | POA: Diagnosis not present

## 2021-03-12 DIAGNOSIS — J3081 Allergic rhinitis due to animal (cat) (dog) hair and dander: Secondary | ICD-10-CM | POA: Diagnosis not present

## 2021-12-21 IMAGING — RF DG ESOPHAGUS
5 series · 14 of 18 positions shown · non-contrast
Comparison: None.

CLINICAL DATA: Dysphagia

EXAM:
ESOPHOGRAM / BARIUM SWALLOW / BARIUM TABLET STUDY
TECHNIQUE: Combined double contrast and single contrast examination performed
using effervescent crystals, thick barium liquid, and thin barium
liquid. The patient was observed with fluoroscopy swallowing a 13 mm
barium sulphate tablet.
FLUOROSCOPY TIME:  Fluoroscopy Time:  1 minutes 24 seconds
Radiation Exposure Index (if provided by the fluoroscopic device):
52 mGy
Number of Acquired Spot Images: 0

[Series 1: sequence · 3 of 50 frames shown (1 of 4)]
[frame 8/50]
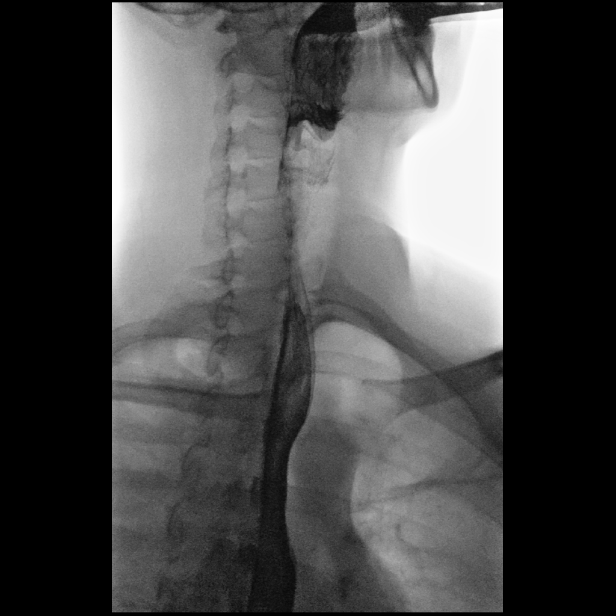
[frame 21/50]
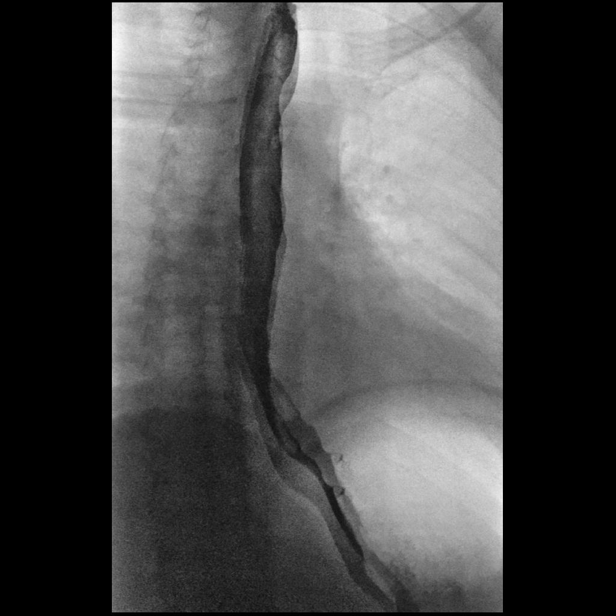
[frame 43/50]
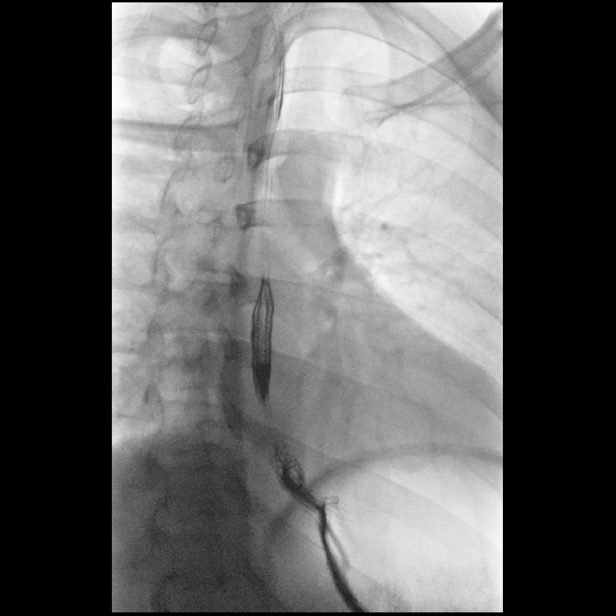

[Series 2: sequence · 3 of 22 frames shown (2 of 4)]
[frame 4/22]
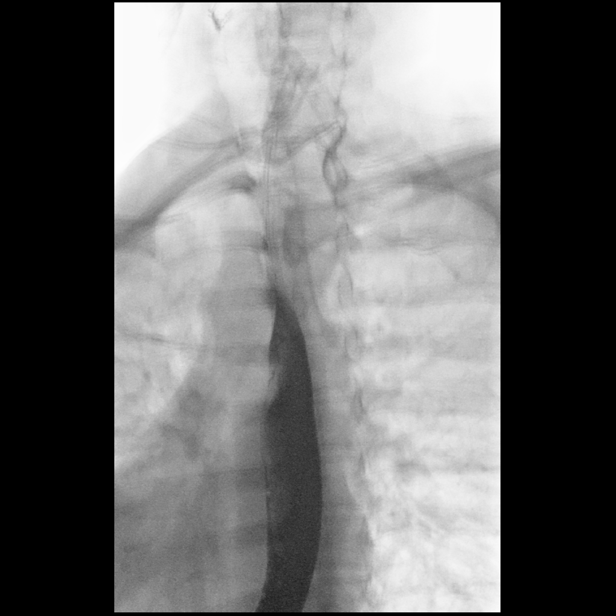
[frame 9/22]
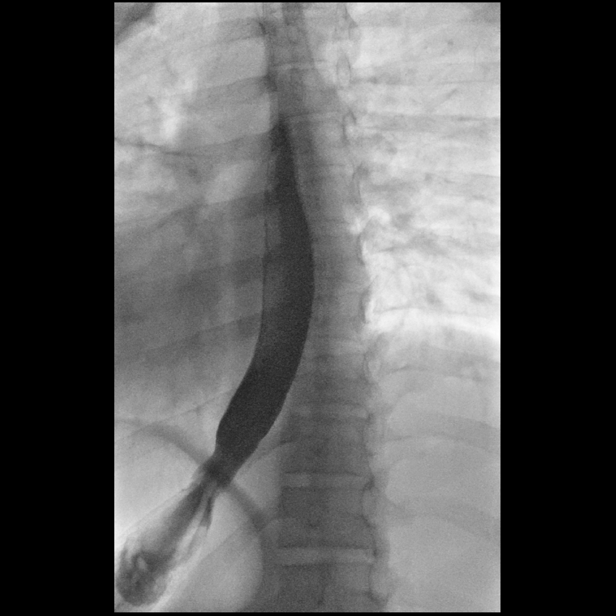
[frame 19/22]
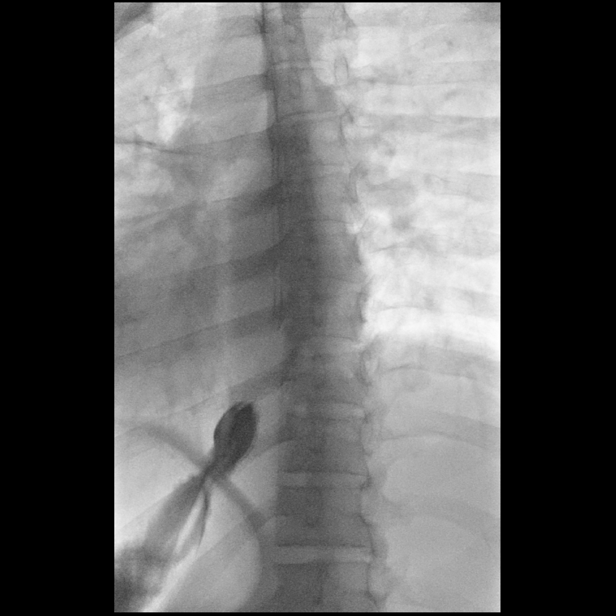

[Series 3: sequence · 3 of 25 frames shown (3 of 4)]
[frame 4/25]
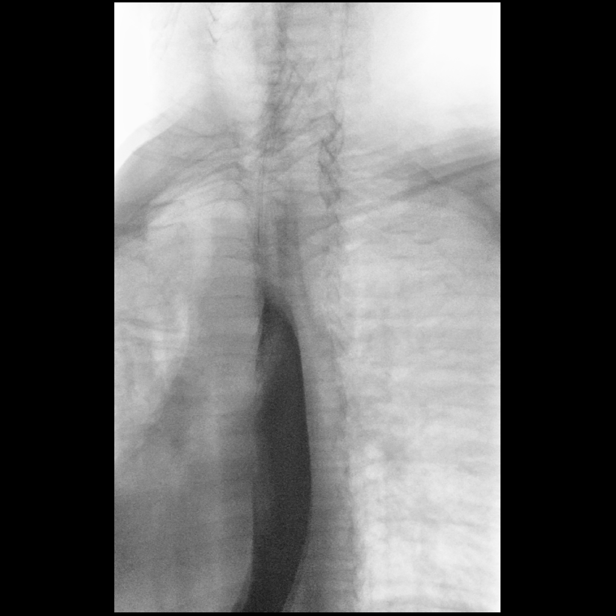
[frame 9/25]
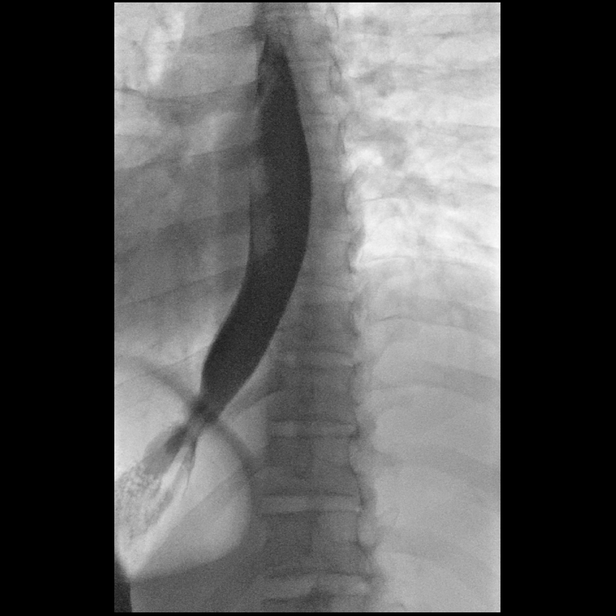
[frame 13/25]
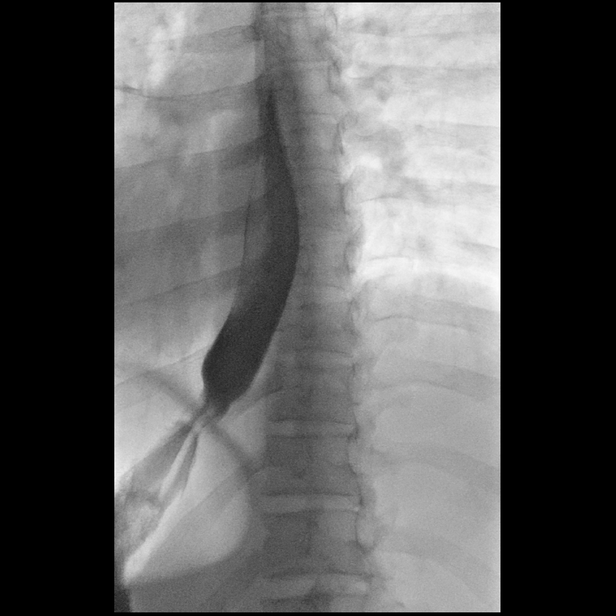

[Series 4: sequence · 3 of 36 frames shown (4 of 4)]
[frame 6/36]
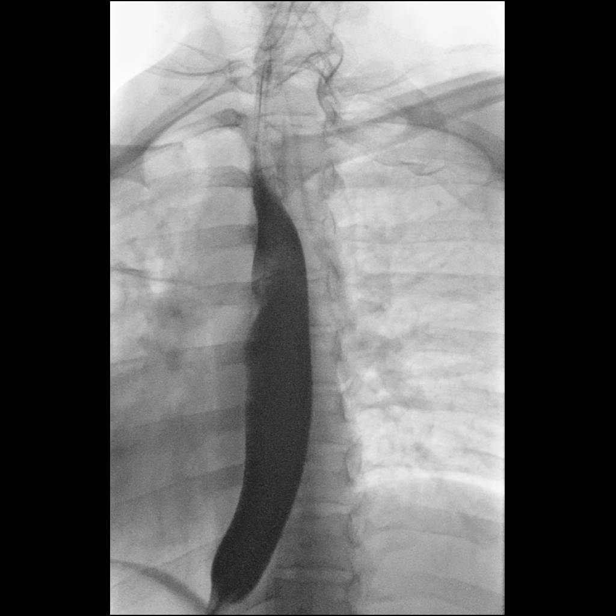
[frame 16/36]
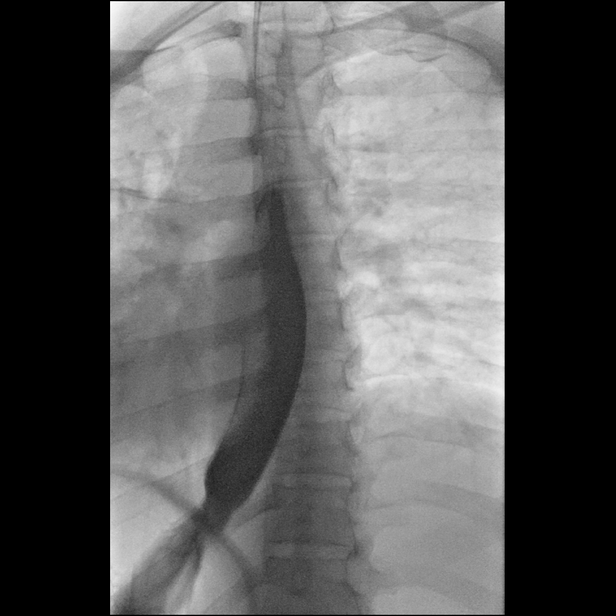
[frame 19/36]
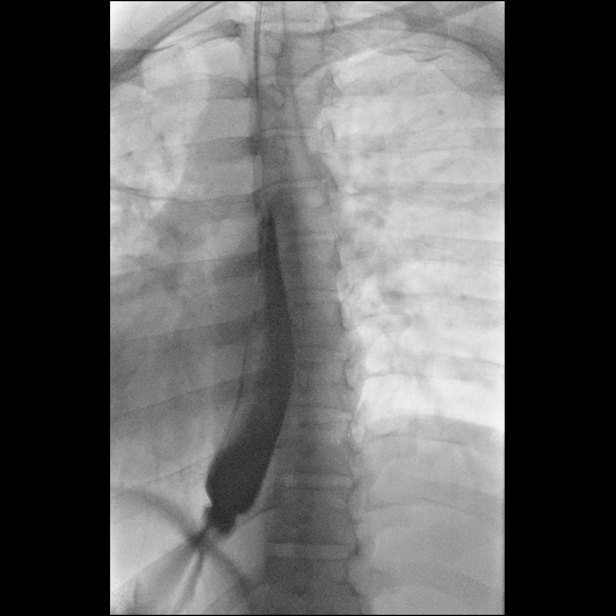

[Series 5: one shot · 2 of 2 slices shown]
[im 1/2]
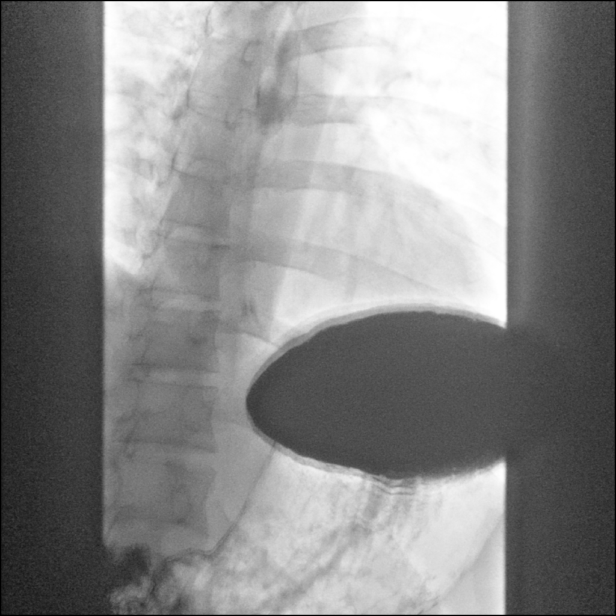
[im 2/2]
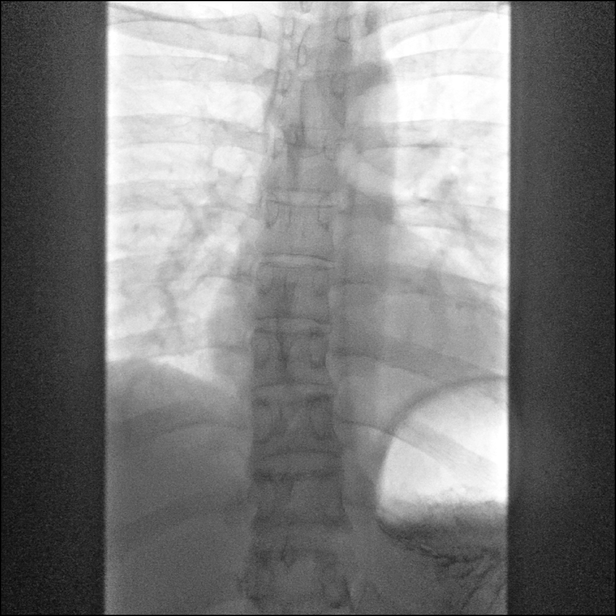

[14 of 18 positions shown; findings below may reference images not displayed]

FINDINGS: Fluoroscopic evaluation of swallowing demonstrates normal esophageal
peristalsis. There is a slight mucosal ring noted in the distal
esophagus. A 13 mm barium tablet does not stick in this area or
reproduce the patient's symptoms. No fold thickening or mass. No
reflux with the water siphon maneuver.
IMPRESSION: Slight mucosal ring in the distal esophagus. The 13 mm barium tablet
freely passes through this area.
